# Patient Record
Sex: Female | Born: 2005 | Race: White | Hispanic: Yes | Marital: Single | Smoking: Never smoker
Health system: Southern US, Community
[De-identification: ages and names within clinical notes are randomized; demographics above are authoritative.]

## PROBLEM LIST (undated history)

## (undated) DIAGNOSIS — K5900 Constipation, unspecified: Secondary | ICD-10-CM

## (undated) DIAGNOSIS — K219 Gastro-esophageal reflux disease without esophagitis: Secondary | ICD-10-CM

## (undated) DIAGNOSIS — F419 Anxiety disorder, unspecified: Secondary | ICD-10-CM

## (undated) DIAGNOSIS — J302 Other seasonal allergic rhinitis: Secondary | ICD-10-CM

## (undated) DIAGNOSIS — F32A Depression, unspecified: Secondary | ICD-10-CM

## (undated) DIAGNOSIS — K429 Umbilical hernia without obstruction or gangrene: Secondary | ICD-10-CM

## (undated) DIAGNOSIS — M1990 Unspecified osteoarthritis, unspecified site: Secondary | ICD-10-CM

## (undated) DIAGNOSIS — Z9622 Myringotomy tube(s) status: Secondary | ICD-10-CM

## (undated) DIAGNOSIS — J45909 Unspecified asthma, uncomplicated: Secondary | ICD-10-CM

## (undated) HISTORY — PX: TONSILLECTOMY: SUR1361

## (undated) HISTORY — DX: Depression, unspecified: F32.A

## (undated) HISTORY — DX: Unspecified osteoarthritis, unspecified site: M19.90

## (undated) HISTORY — DX: Anxiety disorder, unspecified: F41.9

## (undated) HISTORY — PX: WISDOM TOOTH EXTRACTION: SHX21

## (undated) HISTORY — DX: Umbilical hernia without obstruction or gangrene: K42.9

---
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
## 2023-01-06 ENCOUNTER — Encounter: Payer: Self-pay | Admitting: Gastroenterology

## 2023-01-12 ENCOUNTER — Ambulatory Visit (INDEPENDENT_AMBULATORY_CARE_PROVIDER_SITE_OTHER): Payer: MEDICAID | Admitting: Gastroenterology

## 2023-01-12 ENCOUNTER — Other Ambulatory Visit (INDEPENDENT_AMBULATORY_CARE_PROVIDER_SITE_OTHER): Payer: MEDICAID

## 2023-01-12 ENCOUNTER — Encounter: Payer: Self-pay | Admitting: Gastroenterology

## 2023-01-12 VITALS — BP 120/74 | HR 89 | Ht 63.0 in | Wt 226.0 lb

## 2023-01-12 DIAGNOSIS — D509 Iron deficiency anemia, unspecified: Secondary | ICD-10-CM | POA: Diagnosis not present

## 2023-01-12 DIAGNOSIS — R112 Nausea with vomiting, unspecified: Secondary | ICD-10-CM | POA: Diagnosis not present

## 2023-01-12 DIAGNOSIS — R1084 Generalized abdominal pain: Secondary | ICD-10-CM

## 2023-01-12 LAB — IBC + FERRITIN
Ferritin: 3.2 ng/mL — ABNORMAL LOW (ref 10.0–291.0)
Iron: 17 ug/dL — ABNORMAL LOW (ref 42–145)
Saturation Ratios: 3.2 % — ABNORMAL LOW (ref 20.0–50.0)
TIBC: 523.6 ug/dL — ABNORMAL HIGH (ref 250.0–450.0)
Transferrin: 374 mg/dL — ABNORMAL HIGH (ref 212.0–360.0)

## 2023-01-12 LAB — C-REACTIVE PROTEIN: CRP: <1 mg/dL (ref 0.5–20.0)

## 2023-01-12 LAB — VITAMIN D 25 HYDROXY (VIT D DEFICIENCY, FRACTURES): VITD: 11.26 ng/mL — ABNORMAL LOW (ref 30.00–100.00)

## 2023-01-12 MED ORDER — DICYCLOMINE HCL 20 MG PO TABS: 20.0000 mg | ORAL_TABLET | Freq: Four times a day (QID) | ORAL | 1 refills | Status: AC | PRN

## 2023-01-12 NOTE — Progress Notes (Signed)
HPI : Tara [name] is a very pleasant 17 year old female with a history of anxiety and depression who is referred to [place] by Dr. Lady Deutscher for further evaluation of multiple chronic GI symptoms.  The patient states she has had problems with her GI tract for most of her life starting the 6th grade.  She recalls seeing a gastroenterologist in the 7th grade and being told that her symptoms were due to anxiety and was given a medication (doesn't recall what). Now, she feels nauseated 'all the time' and 'can't eat anything'.  She vomits multiple times a week.  Zofran and dramamine do not help.  Promethazine helps, but only if she takes it before 7 am. She denies any prolonged periods without nausea. It has been like this for 2.5 years.  Her weight has been stable despite not being to eat any full meals. She says marijuana does help with her nausea and she uses it frequently. She has irregular bowel movements, usually with frequent loose stools, but other times she has constipation/hard stools.  No blood in the stool. She reports diffuse, poorly localized abdominal pain which bothers her most days. She does have a chronic microcytic anemia, with her most recent hgb 9.6, MCV 68 with reactive thrombocytosis. She has had irregular menses for several months, often missing periods.  She has had a normal CT and RUQUS and negative H. Pylori stool antigen thus far.  Hgb 9.6, MCV 68,  WBC 15, Plt 580 Chronic anemia hgb 11 in Mar 2023, 8-9 in 2024  H. Pylori Ag neg Oct 2022  RUQUS Sept 25, 2024 IMPRESSION: US GALLBLADDER No evidence for cholelithiasis. No acute process identified    CT Abdomen, pelvis Sept 25, 2024 IMPRESSION: CT ABDOMEN PELVIS  1. No acute process identified. 2. Negative for obstructive uropathy or urinary calculus. 3. Mild diffuse urinary bladder wall thickening which may represent a chronic cystitis. 4. Benign-appearing 2.5 cm left ovarian cyst   Past Medical History:   Diagnosis Date   Acid reflux    Anxiety    Arthritis    Asthma    Constipation    Depression    History of placement of ear tubes    Seasonal allergies    Umbilical hernia      Past Surgical History:  Procedure Laterality Date   ADENOIDECTOMY  2014   TONSILLECTOMY     TYMPANOSTOMY TUBE PLACEMENT  2014   WISDOM TOOTH EXTRACTION     Family History  Problem Relation Age of Onset   Depression Mother    Anxiety disorder Mother    Autism Brother    ADD / ADHD Brother    Depression Brother    Anxiety disorder Brother    ADD / ADHD Brother    Autism Brother    Seizures Neg Hx    Migraines Neg Hx    Bipolar disorder Neg Hx    Schizophrenia Neg Hx    Colon cancer Neg Hx    Esophageal cancer Neg Hx    Stomach cancer Neg Hx    Social History   Tobacco Use   Smoking status: Never   Smokeless tobacco: Never  Vaping Use   Vaping status: Never Used  Substance Use Topics   Alcohol use: No    Alcohol/week: 0.0 standard drinks of alcohol   Drug use: No   Current Outpatient Medications  Medication Sig Dispense Refill   albuterol (VENTOLIN HFA) 108 (90 Base) MCG/ACT inhaler Inhale 2 puffs into the lungs  every 4 (four) hours as needed for wheezing or shortness of breath.     OXcarbazepine (TRILEPTAL) 150 MG tablet Take 150 mg by mouth 2 (two) times daily.     promethazine (PHENERGAN) 25 MG tablet Take 25 mg by mouth every 6 (six) hours as needed for nausea or vomiting.     No current facility-administered medications for this visit.   Allergies  Allergen Reactions   Lactose Intolerance (Gi) Diarrhea   Vraylar [Cariprazine] Hives     Review of Systems: All systems reviewed and negative except where noted in HPI.    No results found.  Physical Exam: BP 120/74   Pulse 89   Ht 5\' 3"  (1.6 m)   Wt 226 lb (102.5 kg)   SpO2 98%   BMI 40.03 kg/m  Constitutional: Pleasant,well-developed, Caucasian female in no acute distress.  Accompanied by boyfriend HEENT:  Normocephalic and atraumatic. Conjunctivae are normal. No scleral icterus. Neck supple.  Cardiovascular: Normal rate, regular rhythm.  Pulmonary/chest: Effort normal and breath sounds normal. No wheezing, rales or rhonchi. Abdominal: Soft, nondistended, multifocal tenderness to palpation throughout the abdomen, without rigidity or guarding. Bowel sounds active throughout. There are no masses palpable. No hepatomegaly. Extremities: no edema Lymphadenopathy: No cervical adenopathy noted. Neurological: Alert and oriented to person place and time. Skin: Skin is warm and dry. No rashes noted. Psychiatric: Normal mood and affect. Behavior is normal.  CBC    Component Value Date/Time   WBC 8.1 07/04/2020 1903   RBC 4.58 07/04/2020 1903   HGB 11.7 (L) 07/04/2020 1903   HGB 8.9 (L) 02/18/2020 0832   HCT 37.9 07/04/2020 1903   HCT 29.2 (L) 02/18/2020 0832   PLT 392 07/04/2020 1903   PLT 391 02/18/2020 0832   MCV 82.8 07/04/2020 1903   MCV 75 (L) 02/18/2020 0832   MCH 25.5 07/04/2020 1903   MCHC 30.9 (L) 07/04/2020 1903   RDW 14.9 07/04/2020 1903   RDW 13.9 02/18/2020 0832   LYMPHSABS 2.4 11/25/2019 1206   MONOABS 0.4 09/06/2018 1247   EOSABS 0.2 11/25/2019 1206   BASOSABS 0.1 11/25/2019 1206    CMP     Component Value Date/Time   NA 138 07/04/2020 1903   K 4.0 07/04/2020 1903   CL 105 07/04/2020 1903   CO2 26 07/04/2020 1903   GLUCOSE 85 07/04/2020 1903   BUN 11 07/04/2020 1903   CREATININE 0.81 07/04/2020 1903   CREATININE 0.72 06/26/2019 1137   CALCIUM 9.5 07/04/2020 1903   PROT 7.8 07/04/2020 1903   ALBUMIN 4.5 07/04/2020 1903   AST 17 07/04/2020 1903   ALT 17 07/04/2020 1903   ALKPHOS 69 07/04/2020 1903   BILITOT 0.6 07/04/2020 1903   GFRNONAA NOT CALCULATED 07/04/2020 1903   GFRAA NOT CALCULATED 09/06/2018 1247       Latest Ref Rng & Units 07/04/2020    7:03 PM 04/30/2020   11:21 AM 02/18/2020    8:32 AM  CBC EXTENDED  WBC 4.5 - 13.5 K/uL 8.1  9.7  10.7   RBC  3.80 - 5.70 MIL/uL 4.58  4.68  3.90   Hemoglobin 12.0 - 16.0 g/dL 40.9  81.1  8.9   HCT 91.4 - 49.0 % 37.9  37.3  29.2   Platelets 150 - 400 K/uL 392  322  391       ASSESSMENT AND PLAN:  17 year old female with history of anxiety and depression with longstanding history of numerous GI complaints, with predominant nausea and vomiting symptoms for  the past 2-1/2 years.  Patient reports daily unrelenting nausea and p.o. intolerance, but has not had any weight loss. CT and right upper quadrant ultrasound completely normal.  She does have longstanding iron deficiency anemia.  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  Certainly, the predominance of her nausea and vomiting symptoms raise suspicion for cyclic vomiting syndrome or cannabis hyperemesis syndrome.  Although her symptoms are not cyclic enough to fit the definition of CVS and she does not have pathognomonic features of CHS (relief of symptoms with hot showers), I doubt that there is an organic GI disorder to explain her nausea and vomiting.  We discussed gut brain axis disorders, and the complex relationship between the brain and the GI tract, and how emotions and moods can affect GI symptoms.  With her iron deficiency anemia, certainly further evaluation is warranted, although most likely her anemia is related to menstrual blood loss.  We will evaluate for celiac disease and repeat testing for H. pylori.  Will also obtain inflammatory markers (CRP and fecal lactoferrin) although suspicion for underlying inflammation is very low given normal CT scan.  I do recommend an EGD to more definitively rule out a luminal explanation for her nausea and vomiting, such as a pyloric stricture/stenosis.  Will also plan to take biopsies for H. pylori and celiac disease at that time.  We discussed how marijuana can have complex effects on the GI system, and that any patient that is having issues with nausea and  vomiting and also using marijuana should be advised to stop marijuana, as this has been known to cause symptoms of nausea and vomiting.  The patient was agreeable to this.  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 pylori antigen - Trial of Bentyl  Patient to follow-up in the office following her EGD to further discuss management of her chronic symptoms and discuss other pharmacotherapy such as TCA.  The details, risks (including bleeding, perforation, infection, missed lesions, medication reactions and possible hospitalization or surgery if complications occur), benefits, and alternatives to EGD with possible biopsy and possible dilation were discussed with the patient and she consents to proceed.   I spent a total of 45 minutes reviewing the patient's medical record, interviewing and examining the patient, discussing her diagnosis and management of her condition going forward, and documenting in the medical record   Lady Deutscher, MD

## 2023-01-12 NOTE — Patient Instructions (Signed)
Your provider has requested that you go to the basement level for lab work before leaving today. Press "B" on the elevator. The lab is located at the first door on the left as you exit the elevator.  We have sent the following medications to your pharmacy for you to pick up at your convenience: Bentyl 20 mg every 6 hours as needed.   You have been scheduled for an endoscopy. Please follow written instructions given to you at your visit today.  If you use inhalers (even only as needed), please bring them with you on the day of your procedure.  If you take any of the following medications, they will need to be adjusted prior to your procedure:   DO NOT TAKE 7 DAYS PRIOR TO TEST- Trulicity (dulaglutide) Ozempic, Wegovy (semaglutide) Mounjaro (tirzepatide) Bydureon Bcise (exanatide extended release)  DO NOT TAKE 1 DAY PRIOR TO YOUR TEST Rybelsus (semaglutide) Adlyxin (lixisenatide) Victoza (liraglutide) Byetta (exanatide)  _______________________________________________________  If your blood pressure at your visit was 140/90 or greater, please contact your primary care physician to follow up on this.  _______________________________________________________  If you are age 16 or older, your body mass index should be between 23-30. Your Body mass index is 40.03 kg/m. If this is out of the aforementioned range listed, please consider follow up with your Primary Care Provider.  If you are age 19 or younger, your body mass index should be between 19-25. Your Body mass index is 40.03 kg/m. If this is out of the aformentioned range listed, please consider follow up with your Primary Care Provider.   ________________________________________________________  The [facility] providers would like to encourage you to use [facility] to communicate with providers for non-urgent requests or questions.  Due to long hold times on the telephone, sending your provider a message by [facility] may be a faster  and more efficient way to get a response.  Please allow 48 business hours for a response.  Please remember that this is for non-urgent requests.   It was a pleasure to see you today!  Thank you for trusting me with your gastrointestinal care!    Scott E.Tomasa Rand, MD

## 2023-01-13 LAB — IGA: Immunoglobulin A: 151 mg/dL (ref 47–310)

## 2023-01-13 LAB — TISSUE TRANSGLUTAMINASE, IGA: (tTG) Ab, IgA: <1 U/mL

## 2023-01-19 ENCOUNTER — Other Ambulatory Visit: Payer: MEDICAID

## 2023-01-19 ENCOUNTER — Other Ambulatory Visit: Payer: Self-pay

## 2023-01-19 DIAGNOSIS — E559 Vitamin D deficiency, unspecified: Secondary | ICD-10-CM

## 2023-01-19 DIAGNOSIS — D509 Iron deficiency anemia, unspecified: Secondary | ICD-10-CM

## 2023-01-19 DIAGNOSIS — R112 Nausea with vomiting, unspecified: Secondary | ICD-10-CM

## 2023-01-19 DIAGNOSIS — R1084 Generalized abdominal pain: Secondary | ICD-10-CM

## 2023-01-19 MED ORDER — VITAMIN D (ERGOCALCIFEROL) 1.25 MG (50000 UNIT) PO CAPS: 50000.0000 [IU] | ORAL_CAPSULE | ORAL | 0 refills | Status: AC

## 2023-01-19 MED ORDER — FERROUS SULFATE 325 (65 FE) MG PO TABS: ORAL_TABLET | ORAL | 0 refills | Status: AC

## 2023-01-19 NOTE — Progress Notes (Signed)
Tara [name],  Your vitamin D and iron levels were low.  Your CRP (marker of inflammation) and tests were celiac disease were normal. I recommend you take some supplemental vitamin D and iron to get these within normal range.  You may feel better when these are repleted.  Please take ergocalciferol 50,000 units once a week for 16 weeks and ferrous sulfate once a day for 16 weeks.  Let's recheck levels at the end of that 16 weeks. The iron supplements will often make stools appear black and can sometimes cause constipation or GI upset.  Please let us know if you experience these symptoms.  Linda,  Please prescribed ergocalciferol 50,000 units PO weekly x 16 weeks #16 rf0 And ferrous sulfate 325 mg PO daily for 4 months #120 rf0 And place an order for Vit D level and iron panel to be drawn in 4 months.

## 2023-01-20 LAB — FECAL LACTOFERRIN, QUANT
Fecal Lactoferrin: NEGATIVE
MICRO NUMBER:: 15579011
SPECIMEN QUALITY:: ADEQUATE

## 2023-01-21 LAB — H. PYLORI ANTIGEN, STOOL: H pylori Ag, Stl: NEGATIVE

## 2023-01-21 NOTE — Progress Notes (Signed)
Tara [name],  Your H. Pylori stool test was negative (not detected) and your fecal lactoferrin was negative, indicating that there is no significant inflammation in the GI tract.

## 2023-02-02 ENCOUNTER — Encounter: Payer: Self-pay | Admitting: Gastroenterology

## 2023-02-02 ENCOUNTER — Ambulatory Visit: Payer: MEDICAID | Admitting: Gastroenterology

## 2023-02-02 VITALS — BP 110/66 | HR 81 | Temp 98.6°F | Resp 22 | Ht 63.0 in | Wt 226.0 lb

## 2023-02-02 DIAGNOSIS — D509 Iron deficiency anemia, unspecified: Secondary | ICD-10-CM

## 2023-02-02 DIAGNOSIS — K3184 Gastroparesis: Secondary | ICD-10-CM

## 2023-02-02 DIAGNOSIS — K2950 Unspecified chronic gastritis without bleeding: Secondary | ICD-10-CM | POA: Diagnosis not present

## 2023-02-02 DIAGNOSIS — R112 Nausea with vomiting, unspecified: Secondary | ICD-10-CM

## 2023-02-02 MED ORDER — SODIUM CHLORIDE 0.9 % IV SOLN: 500.0000 mL | INTRAVENOUS | Status: DC

## 2023-02-02 NOTE — Progress Notes (Signed)
Uneventful anesthetic. Report to pacu rn. Vss. Care resumed by rn. 

## 2023-02-02 NOTE — Patient Instructions (Addendum)

## 2023-02-02 NOTE — Op Note (Signed)
[facility] Patient Name: Tara [name] Procedure Date: 02/02/2023 10:21 AM MRN: [id number] Endoscopist: Lorin Picket E. Tomasa Rand , MD, [id number] Age: 17 Referring MD:  Date of Birth: 2005/05/18 Gender: Female Account #: [id number] Procedure:                Upper GI endoscopy Indications:              Iron deficiency anemia, Nausea with vomiting Medicines:                Monitored Anesthesia Care Procedure:                Pre-Anesthesia Assessment:                           - Prior to the procedure, a History and Physical                            was performed, and patient medications and                            allergies were reviewed. The patient's tolerance of                            previous anesthesia was also reviewed. The risks                            and benefits of the procedure and the sedation                            options and risks were discussed with the patient.                            All questions were answered, and informed consent                            was obtained. Prior Anticoagulants: The patient has                            taken no anticoagulant or antiplatelet agents. ASA                            Grade Assessment: III - A patient with severe                            systemic disease. After reviewing the risks and                            benefits, the patient was deemed in satisfactory                            condition to undergo the procedure.                           After obtaining informed consent, the endoscope was  passed under direct vision. Throughout the                            procedure, the patient's blood pressure, pulse, and                            oxygen saturations were monitored continuously. The                            GIF [id number] #[id number] was introduced through the                            mouth, and advanced to the second part of duodenum.                             The upper GI endoscopy was accomplished without                            difficulty. The patient tolerated the procedure                            well. Scope In: Scope Out: Findings:                 The examined portions of the nasopharynx,                            oropharynx and larynx were normal.                           The examined esophagus was normal.                           A medium amount of food (residue) was found in the                            gastric fundus and in the gastric body.                           The exam of the stomach was otherwise normal.                           Biopsies were taken with a cold forceps in the                            gastric antrum for Helicobacter pylori testing.                            Estimated blood loss was minimal.                           The examined duodenum was normal. Biopsies for                            histology were taken with a cold forceps  for                            evaluation of celiac disease. Estimated blood loss                            was minimal. Complications:            No immediate complications. Estimated Blood Loss:     Estimated blood loss was minimal. Impression:               - The examined portions of the nasopharynx,                            oropharynx and larynx were normal.                           - Normal esophagus.                           - A medium amount of food (residue) in the stomach.                            This could be indicative of gastroparesis.                           - Normal examined duodenum. Biopsied.                           - Biopsies were taken with a cold forceps for                            Helicobacter pylori testing. Recommendation:           - Patient has a contact number available for                            emergencies. The signs and symptoms of potential                            delayed complications were discussed with the                             patient. Return to normal activities tomorrow.                            Written discharge instructions were provided to the                            patient.                           - Resume previous diet.                           - Continue present medications.                           -  Await pathology results.                           - Do a gastric emptying study at appointment to be                            scheduled. Desani Sprung E. Tomasa Rand, MD 02/02/2023 10:47:03 AM This report has been signed electronically.

## 2023-02-02 NOTE — Progress Notes (Signed)
Called to room to assist during endoscopic procedure.  Patient ID and intended procedure confirmed with present staff. Received instructions for my participation in the procedure from the performing physician.  

## 2023-02-02 NOTE — Progress Notes (Signed)
History and Physical Interval Note:  02/02/2023 10:27 AM  Tara [name]  has presented today for endoscopic procedure(s), with the diagnosis of  Encounter Diagnosis  Name Primary?   Nausea and vomiting, unspecified vomiting type Yes  .  The various methods of evaluation and treatment have been discussed with the patient and/or family. After consideration of risks, benefits and other options for treatment, the patient has consented to  the endoscopic procedure(s).   The patient's history has been reviewed, patient examined, no change in status, stable for endoscopic procedure(s).  I have reviewed the patient's chart and labs.  Questions were answered to the patient's satisfaction.     Atlantis Delong E. Tomasa Rand, MD [facility]

## 2023-02-03 ENCOUNTER — Telehealth: Payer: Self-pay | Admitting: *Deleted

## 2023-02-03 NOTE — Telephone Encounter (Signed)
  Follow up Call-     02/02/2023    9:50 AM  Call back number  Post procedure Call Back phone  # Sharia Reeve (husband) [phone]  Permission to leave phone message Yes     Patient questions:   No Voice mail.

## 2023-02-06 ENCOUNTER — Telehealth: Payer: Self-pay

## 2023-02-06 DIAGNOSIS — R112 Nausea with vomiting, unspecified: Secondary | ICD-10-CM

## 2023-02-06 LAB — SURGICAL PATHOLOGY

## 2023-02-06 NOTE — Telephone Encounter (Signed)
I left her a message to call me so I can find out what are good times/dates to set up her gastric emptying study.

## 2023-02-07 NOTE — Telephone Encounter (Signed)
Tara [name] sent me a [facility] message and I got her set up for her scan on 02/22/2023 at 8:00am, arrive at 7:30AM. NPO midnight and no stomach meds after midnight. I have sent her the details and will await for her to confirm she got the message before I close out this chart.

## 2023-02-08 ENCOUNTER — Ambulatory Visit: Payer: MEDICAID | Admitting: Pediatrics

## 2023-02-11 ENCOUNTER — Encounter: Payer: Self-pay | Admitting: Gastroenterology

## 2023-02-13 NOTE — Progress Notes (Signed)
Tara [name],  The biopsies taken from you duodenum were unremarkable, with no evidence of celiac disease The biopsies taken from your stomach were notable for mild chronic gastritis (inflammation) which is a common finding, but there was no evidence of Helicobacter pylori infection. This common finding is not likely to explain any symptoms and there is no specific treatment or further evaluation recommended.  Please complete the gastric emptying study to evaluate for gastroparesis and follow up with me in the office as needed.

## 2023-02-22 ENCOUNTER — Ambulatory Visit (HOSPITAL_COMMUNITY)
Admission: RE | Admit: 2023-02-22 | Discharge: 2023-02-22 | Disposition: A | Discharge status: Home / Self Care | Payer: MEDICAID | Source: Ambulatory Visit | Attending: Gastroenterology | Admitting: Gastroenterology

## 2023-02-22 DIAGNOSIS — R112 Nausea with vomiting, unspecified: Secondary | ICD-10-CM

## 2023-02-22 MED ORDER — TECHNETIUM TC 99M SULFUR COLLOID: 1.9900 | Freq: Once | INTRAVENOUS | Status: DC

## 2023-02-22 NOTE — Progress Notes (Signed)
Tara [name],  Your gastric emptying study was normal.  You do not have gastroparesis.  Please follow up with me in the office as needed for ongoing management of your persistent GI symptoms.

## 2023-05-09 ENCOUNTER — Encounter: Payer: Self-pay | Admitting: Gastroenterology
